# Patient Record
Sex: Male | Born: 1990 | Race: Black or African American | Hispanic: No | Marital: Single | Smoking: Never smoker
Health system: Southern US, Community
[De-identification: ages and names within clinical notes are randomized; demographics above are authoritative.]

## PROBLEM LIST (undated history)

## (undated) DIAGNOSIS — Z923 Personal history of irradiation: Secondary | ICD-10-CM

## (undated) DIAGNOSIS — L910 Hypertrophic scar: Secondary | ICD-10-CM

## (undated) HISTORY — PX: KELOID EXCISION: SHX1856

---
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
## 2014-02-20 DIAGNOSIS — L910 Hypertrophic scar: Secondary | ICD-10-CM

## 2014-02-20 HISTORY — DX: Hypertrophic scar: L91.0

## 2014-03-03 ENCOUNTER — Encounter (HOSPITAL_BASED_OUTPATIENT_CLINIC_OR_DEPARTMENT_OTHER): Payer: Self-pay | Admitting: *Deleted

## 2014-03-04 ENCOUNTER — Other Ambulatory Visit: Payer: Self-pay | Admitting: Plastic Surgery

## 2014-03-04 DIAGNOSIS — L910 Hypertrophic scar: Secondary | ICD-10-CM

## 2014-03-04 NOTE — H&P (Signed)
Kevin Sullivan is an 22 y.o. male.   Chief Complaint: left earlobe keloid HPI: The patient is a 22 yrs old bm here for evaluation/treatment of a growing keloid on the left posterior ear lobe. He had his ears pierced in 2011. Over the past 2 years he noticed the keloid growing and underwent 7 kenalog injections for treatment. He is concerned that the growth has not stopped and that it will get larger. He has no other keloids. The right lobe has a small hypertrophic scar. He is otherwise in good health and there are no known keloids in mother or father.   Past Medical History  Diagnosis Date  . Keloid 02/2014    left ear lobe    Past Surgical History  Procedure Laterality Date  . Closed reduction distal radius fracture Right 05/08/2006    with application of cast  . Keloid excision Left     ear lobe    No family history on file. Social History:  reports that he has never smoked. He has never used smokeless tobacco. He reports that he does not drink alcohol or use illicit drugs.  Allergies: No Known Allergies   (Not in a hospital admission)  No results found for this or any previous visit (from the past 48 hour(s)). No results found.  Review of Systems  Constitutional: Negative.   HENT: Negative.   Eyes: Negative.   Respiratory: Negative.   Cardiovascular: Negative.   Gastrointestinal: Negative.   Genitourinary: Negative.   Musculoskeletal: Negative.   Skin: Negative.   Neurological: Negative.   Psychiatric/Behavioral: Negative.     There were no vitals taken for this visit. Physical Exam  Constitutional: He appears well-developed and well-nourished.  HENT:  Head: Normocephalic and atraumatic.  Eyes: Conjunctivae and EOM are normal. Pupils are equal, round, and reactive to light.  Cardiovascular: Normal rate.   Respiratory: Effort normal.  Musculoskeletal: Normal range of motion.  Neurological: He is alert.  Skin: Skin is warm.  Psychiatric: He has a normal mood  and affect. His behavior is normal. Judgment and thought content normal.     Assessment/Plan Plan for excision of left earlobe keloid with Acell placement and primary closure.  SANGER,CLAIRE 03/04/2014, 3:09 PM    

## 2014-03-08 NOTE — Progress Notes (Signed)
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 male with a growing keloid on the left posterior ear lobe. He had his ears pierced in 2011 and over the past 2 years he has noticed the keloid growing. He underwent 7 kenalog injections for this but does not feel they helped. He has no other keloids  Past/Anticipated interventions by patient's surgeon/dermatologist:Dr. Sanger plans to excise this on 3-19 and has referred the patient for radiation treatments due to concern of recurrence  .  current problematic lesion, if any: left Posterior Ear Lobe  Past skin cancers, if any:  1) Location/Histology/Intervention:Left Posterior Ear Keloid  SAFETY ISSUES:  Prior radiation? No  Pacemaker/ICD? No  Possible current pregnancy?N/A  Is the patient on methotrexate? No  Current Complaints / other details: reports that he has never smoked. He has never used smokeless tobacco. He reports that he does not drink alcohol or use illicit drugs. Denies taking any daily medications. Reports occasionally his left ear keloid aches but, not so much he has to take medication to relieve this pain. Presented for consultation with his mother, Mrs. Kevin Sullivan.

## 2014-03-08 NOTE — Progress Notes (Signed)
Radiation Oncology         [phone] ________________________________  Initial outpatient Consultation  Name: Kevin Sullivan MRN: [id number]  Date: 03/09/2014  DOB: 09-Dec-1991  CC:No PCP Per Patient  Sanger, Alan Ripper, DO   REFERRING PHYSICIAN: Sanger, Alan Ripper, DO  DIAGNOSIS: The primary encounter diagnosis was Keloid. A diagnosis of Keloid scar of skin was also pertinent to this visit.  HISTORY OF PRESENT ILLNESS::Kevin Sullivan is a 23 y.o. male with a growing keloid on the left posterior ear lobe. He had his ears pierced in 2011 and over the past 2 years he has noticed the keloid growing. He had this previously removed in 2012 or 2013, and recalls laser treatments after surgical removal. However, the keloid returned. He underwent 7 kenalog injections for this but does not feel they helped very much. He has no other keloids.  He anticipated surgery tomorrow with Dr. Kelly Splinter.  He reports that she discussed some type of "shots" as prophylactic treatment after surgery, but she parenthetically felt that radiotherapy was a better alternative to prevent recurrences. His mother believes that the shots she was talking about were steroid treatments.  PREVIOUS RADIATION THERAPY: No  PAST MEDICAL HISTORY:  has a past medical history of Keloid (02/2014).    PAST SURGICAL HISTORY: Past Surgical History  Procedure Laterality Date  . Closed reduction distal radius fracture Right 05/08/2006    with application of cast  . Keloid excision Left     ear lobe    FAMILY HISTORY: family history is not on file.  SOCIAL HISTORY:  reports that he has never smoked. He has never used smokeless tobacco. He reports that he does not drink alcohol or use illicit drugs.  ALLERGIES: Review of patient's allergies indicates no known allergies.  MEDICATIONS:  No current outpatient prescriptions on file.   No current facility-administered medications for this encounter.    REVIEW OF SYSTEMS:  Notable for  that above.   PHYSICAL EXAM:  height is 6\' 1"  (1.854 m) and weight is 170 lb 4.8 oz (77.248 kg). His oral temperature is 97.8 F (36.6 C). His blood pressure is 130/94 and his pulse is 75. His respiration is 16 and oxygen saturation is 100%.   Well nourished. NAD. Alert and oriented x 3. The left posterior ear lobe has a round keloid that is approximately 1-1/2 cm in greatest dimension. There is a hypertrophic scar over the right earlobe at the site of prior piercing   LABORATORY DATA:  No results found for this basename: WBC,  HGB,  HCT,  MCV,  PLT   CMP  No results found for this basename: na,  k,  cl,  co2,  glucose,  bun,  creatinine,  calcium,  prot,  albumin,  ast,  alt,  alkphos,  bilitot,  gfrnonaa,  gfraa       RADIOGRAPHY: No results found.    IMPRESSION/PLAN: This is a very pleasant 23 year old man who has a keloid of the left ear lobe.   Dr. Kelly Splinter plans to excise this on 3-19 and has referred the patient for radiation treatments due to concern of recurrence.  I discussed steroid injections as an alternative therapy to prevent recurrences. However, the patient and his mother are more enthusiastic about radiotherapy.  I had an in depth discussion with the patient about the risks benefits and side effects of radiotherapy to the left earlobe scar postoperatively. He understands that radiotherapy would need to be planned for Friday morning, ideally  with the first fraction delivered  Later in the day on Friday, March 20, for best results. This will be followed by 2 more fractions of radiotherapy with 1-2 days of break between each treatment. He understands that treatment will be very superficial and targeted, to a limited amount of tissue, to minimize side effects. He understands that the most common side effects are temporary fatigue as well as skin irritation which can result in long-term changes in skin pigmentation. There is a small risk of damage to the cartilage or underlying  bone. The risk of secondary malignancy is very small but not nonexistent. The patient understands these risks but is still very enthusiastic about proceeding with treatment.    I plan to treat the patient's scar with tight margins to 12 Gray in 3 fractions. I will use electrons with bolus to allow a high dose to the skin with minimal deep penetration into normal tissues.   __________________________________________   Lonie PeakSarah Kelvyn Schunk, MD

## 2014-03-09 ENCOUNTER — Encounter: Payer: Self-pay | Admitting: Radiation Oncology

## 2014-03-09 ENCOUNTER — Ambulatory Visit
Admission: RE | Admit: 2014-03-09 | Discharge: 2014-03-09 | Disposition: A | Discharge status: Home / Self Care | Payer: No Typology Code available for payment source | Source: Ambulatory Visit | Attending: Radiation Oncology | Admitting: Radiation Oncology

## 2014-03-09 VITALS — BP 130/94 | HR 75 | Temp 97.8°F | Resp 16 | Ht 73.0 in | Wt 170.3 lb

## 2014-03-09 DIAGNOSIS — L910 Hypertrophic scar: Secondary | ICD-10-CM | POA: Insufficient documentation

## 2014-03-09 NOTE — Addendum Note (Signed)
Encounter addended by: Delynn Flavinheryl Mintz Brean Carberry, RN on: 03/09/2014  2:49 PM<BR>     Documentation filed: Visit Diagnoses

## 2014-03-09 NOTE — Progress Notes (Signed)
See progress note under physician encounter. 

## 2014-03-09 NOTE — Addendum Note (Signed)
Encounter addended by: Delynn Flavinheryl Mintz Ramon Zanders, RN on: 03/09/2014 10:34 AM<BR>     Documentation filed: Charges VN

## 2014-03-10 ENCOUNTER — Encounter (HOSPITAL_BASED_OUTPATIENT_CLINIC_OR_DEPARTMENT_OTHER): Payer: Self-pay

## 2014-03-10 ENCOUNTER — Encounter (HOSPITAL_BASED_OUTPATIENT_CLINIC_OR_DEPARTMENT_OTHER): Payer: No Typology Code available for payment source | Admitting: Anesthesiology

## 2014-03-10 ENCOUNTER — Encounter (HOSPITAL_BASED_OUTPATIENT_CLINIC_OR_DEPARTMENT_OTHER)
Admission: RE | Disposition: A | Discharge status: Home / Self Care | Payer: Self-pay | Source: Ambulatory Visit | Attending: Plastic Surgery

## 2014-03-10 ENCOUNTER — Ambulatory Visit (HOSPITAL_BASED_OUTPATIENT_CLINIC_OR_DEPARTMENT_OTHER)
Admission: RE | Admit: 2014-03-10 | Discharge: 2014-03-10 | Disposition: A | Discharge status: Home / Self Care | Payer: No Typology Code available for payment source | Source: Ambulatory Visit | Attending: Plastic Surgery | Admitting: Plastic Surgery

## 2014-03-10 ENCOUNTER — Ambulatory Visit (HOSPITAL_BASED_OUTPATIENT_CLINIC_OR_DEPARTMENT_OTHER): Payer: No Typology Code available for payment source | Admitting: Anesthesiology

## 2014-03-10 DIAGNOSIS — L910 Hypertrophic scar: Secondary | ICD-10-CM | POA: Insufficient documentation

## 2014-03-10 HISTORY — PX: APPLICATION OF A-CELL OF HEAD/NECK: SHX6304

## 2014-03-10 HISTORY — PX: LESION REMOVAL: SHX5196

## 2014-03-10 HISTORY — DX: Hypertrophic scar: L91.0

## 2014-03-10 LAB — POCT HEMOGLOBIN-HEMACUE: Hemoglobin: 14.5 g/dL (ref 13.0–17.0)

## 2014-03-10 SURGERY — WIDE EXCISION, LESION, UPPER EXTREMITY
Anesthesia: General | Site: Ear | Laterality: Left

## 2014-03-10 MED ORDER — FENTANYL CITRATE 0.05 MG/ML IJ SOLN
INTRAMUSCULAR | Status: AC
  Filled 2014-03-10: qty 6

## 2014-03-10 MED ORDER — OXYCODONE HCL 5 MG PO TABS: 5.0000 mg | ORAL_TABLET | Freq: Once | ORAL | Status: DC | PRN

## 2014-03-10 MED ORDER — SUCCINYLCHOLINE CHLORIDE 20 MG/ML IJ SOLN
INTRAMUSCULAR | Status: AC
  Filled 2014-03-10: qty 1

## 2014-03-10 MED ORDER — MIDAZOLAM HCL 2 MG/ML PO SYRP: 12.0000 mg | ORAL_SOLUTION | Freq: Once | ORAL | Status: DC | PRN

## 2014-03-10 MED ORDER — OXYCODONE HCL 5 MG/5ML PO SOLN: 5.0000 mg | Freq: Once | ORAL | Status: DC | PRN

## 2014-03-10 MED ORDER — LACTATED RINGERS IV SOLN
INTRAVENOUS | Status: DC
  Administered 2014-03-10: 07:00:00 via INTRAVENOUS

## 2014-03-10 MED ORDER — CEFAZOLIN SODIUM-DEXTROSE 2-3 GM-% IV SOLR
2.0000 g | INTRAVENOUS | Status: AC
  Administered 2014-03-10: 2 g via INTRAVENOUS

## 2014-03-10 MED ORDER — FENTANYL CITRATE 0.05 MG/ML IJ SOLN
50.0000 ug | Freq: Once | INTRAMUSCULAR | Status: DC
Start: 2014-03-10 — End: 2014-03-10

## 2014-03-10 MED ORDER — LIDOCAINE-EPINEPHRINE 1 %-1:100000 IJ SOLN
INTRAMUSCULAR | Status: DC | PRN
  Administered 2014-03-10: 1 mL

## 2014-03-10 MED ORDER — CEFAZOLIN SODIUM-DEXTROSE 2-3 GM-% IV SOLR
INTRAVENOUS | Status: AC
  Filled 2014-03-10: qty 50

## 2014-03-10 MED ORDER — BACITRACIN ZINC 500 UNIT/GM EX OINT
TOPICAL_OINTMENT | CUTANEOUS | Status: AC
  Filled 2014-03-10: qty 0.9

## 2014-03-10 MED ORDER — MIDAZOLAM HCL 2 MG/2ML IJ SOLN
INTRAMUSCULAR | Status: AC
  Filled 2014-03-10: qty 2

## 2014-03-10 MED ORDER — BUPIVACAINE-EPINEPHRINE PF 0.25-1:200000 % IJ SOLN
INTRAMUSCULAR | Status: AC
  Filled 2014-03-10: qty 30

## 2014-03-10 MED ORDER — DEXAMETHASONE SODIUM PHOSPHATE 4 MG/ML IJ SOLN
INTRAMUSCULAR | Status: DC | PRN
  Administered 2014-03-10: 10 mg via INTRAVENOUS

## 2014-03-10 MED ORDER — ONDANSETRON HCL 4 MG/2ML IJ SOLN
INTRAMUSCULAR | Status: DC | PRN
  Administered 2014-03-10: 4 mg via INTRAVENOUS

## 2014-03-10 MED ORDER — MIDAZOLAM HCL 5 MG/5ML IJ SOLN
INTRAMUSCULAR | Status: DC | PRN
  Administered 2014-03-10: 2 mg via INTRAVENOUS

## 2014-03-10 MED ORDER — LIDOCAINE HCL (CARDIAC) 20 MG/ML IV SOLN
INTRAVENOUS | Status: DC | PRN
  Administered 2014-03-10: 50 mg via INTRAVENOUS

## 2014-03-10 MED ORDER — PROMETHAZINE HCL 25 MG/ML IJ SOLN: 12.5000 mg | Freq: Once | INTRAMUSCULAR | Status: DC | PRN

## 2014-03-10 MED ORDER — HYDROMORPHONE HCL PF 1 MG/ML IJ SOLN: 0.2500 mg | INTRAMUSCULAR | Status: DC | PRN

## 2014-03-10 MED ORDER — LIDOCAINE-EPINEPHRINE 1 %-1:100000 IJ SOLN
INTRAMUSCULAR | Status: AC
  Filled 2014-03-10: qty 1

## 2014-03-10 MED ORDER — FENTANYL CITRATE 0.05 MG/ML IJ SOLN: 50.0000 ug | INTRAMUSCULAR | Status: DC | PRN

## 2014-03-10 MED ORDER — MIDAZOLAM HCL 2 MG/2ML IJ SOLN: 1.0000 mg | INTRAMUSCULAR | Status: DC | PRN

## 2014-03-10 MED ORDER — PROPOFOL 10 MG/ML IV EMUL
INTRAVENOUS | Status: AC
  Filled 2014-03-10: qty 50

## 2014-03-10 MED ORDER — FENTANYL CITRATE 0.05 MG/ML IJ SOLN
INTRAMUSCULAR | Status: DC | PRN
Start: 2014-03-10 — End: 2014-03-10
  Administered 2014-03-10: 100 ug via INTRAVENOUS

## 2014-03-10 MED ORDER — PROPOFOL 10 MG/ML IV BOLUS
INTRAVENOUS | Status: DC | PRN
  Administered 2014-03-10: 250 mg via INTRAVENOUS

## 2014-03-10 SURGICAL SUPPLY — 84 items
ADH SKN CLS APL DERMABOND .7 (GAUZE/BANDAGES/DRESSINGS)
BAG DECANTER FOR FLEXI CONT (MISCELLANEOUS) IMPLANT
BLADE 11 SAFETY STRL DISP (BLADE) ×2 IMPLANT
BLADE HEX COATED 2.75 (ELECTRODE) IMPLANT
BLADE SURG 10 STRL SS (BLADE) IMPLANT
BLADE SURG 15 STRL LF DISP TIS (BLADE) ×1 IMPLANT
BLADE SURG 15 STRL SS (BLADE) ×3
BLADE SURG ROTATE 9660 (MISCELLANEOUS) IMPLANT
BNDG CMPR MD 5X2 ELC HKLP STRL (GAUZE/BANDAGES/DRESSINGS)
BNDG CONFORM 2 STRL LF (GAUZE/BANDAGES/DRESSINGS) IMPLANT
BNDG ELASTIC 2 VLCR STRL LF (GAUZE/BANDAGES/DRESSINGS) IMPLANT
CANISTER SUCT 1200ML W/VALVE (MISCELLANEOUS) IMPLANT
CHLORAPREP W/TINT 26ML (MISCELLANEOUS) IMPLANT
CLOSURE WOUND 1/2 X4 (GAUZE/BANDAGES/DRESSINGS)
CORDS BIPOLAR (ELECTRODE) IMPLANT
COVER MAYO STAND STRL (DRAPES) ×3 IMPLANT
COVER TABLE BACK 60X90 (DRAPES) ×3 IMPLANT
DECANTER SPIKE VIAL GLASS SM (MISCELLANEOUS) ×3 IMPLANT
DERMABOND ADVANCED (GAUZE/BANDAGES/DRESSINGS)
DERMABOND ADVANCED .7 DNX12 (GAUZE/BANDAGES/DRESSINGS) IMPLANT
DRAPE INCISE IOBAN 66X45 STRL (DRAPES) IMPLANT
DRAPE PED LAPAROTOMY (DRAPES) IMPLANT
DRAPE U-SHAPE 76X120 STRL (DRAPES) ×3 IMPLANT
DRSG ADAPTIC 3X8 NADH LF (GAUZE/BANDAGES/DRESSINGS) IMPLANT
DRSG EMULSION OIL 3X3 NADH (GAUZE/BANDAGES/DRESSINGS) IMPLANT
DRSG PAD ABDOMINAL 8X10 ST (GAUZE/BANDAGES/DRESSINGS) IMPLANT
DRSG TEGADERM 2-3/8X2-3/4 SM (GAUZE/BANDAGES/DRESSINGS) IMPLANT
ELECT NDL BLADE 2-5/6 (NEEDLE) ×1 IMPLANT
ELECT NEEDLE BLADE 2-5/6 (NEEDLE) ×3 IMPLANT
ELECT REM PT RETURN 9FT ADLT (ELECTROSURGICAL) ×3
ELECT REM PT RETURN 9FT PED (ELECTROSURGICAL)
ELECTRODE REM PT RETRN 9FT PED (ELECTROSURGICAL) IMPLANT
ELECTRODE REM PT RTRN 9FT ADLT (ELECTROSURGICAL) ×1 IMPLANT
GAUZE XEROFORM 1X8 LF (GAUZE/BANDAGES/DRESSINGS) ×3 IMPLANT
GLOVE BIO SURGEON STRL SZ 6.5 (GLOVE) ×5 IMPLANT
GLOVE BIO SURGEONS STRL SZ 6.5 (GLOVE) ×3
GLOVE BIOGEL PI IND STRL 7.5 (GLOVE) IMPLANT
GLOVE BIOGEL PI INDICATOR 7.5 (GLOVE) ×2
GLOVE SURG SS PI 7.5 STRL IVOR (GLOVE) ×2 IMPLANT
GOWN STRL REUS W/ TWL LRG LVL3 (GOWN DISPOSABLE) ×2 IMPLANT
GOWN STRL REUS W/ TWL XL LVL3 (GOWN DISPOSABLE) IMPLANT
GOWN STRL REUS W/TWL LRG LVL3 (GOWN DISPOSABLE) ×6
GOWN STRL REUS W/TWL XL LVL3 (GOWN DISPOSABLE) ×3
MICROMATRIX 500MG (Tissue) ×3 IMPLANT
NDL HYPO 30GX1 BEV (NEEDLE) ×1 IMPLANT
NEEDLE 27GAX1X1/2 (NEEDLE) IMPLANT
NEEDLE HYPO 30GX1 BEV (NEEDLE) ×3 IMPLANT
NS IRRIG 1000ML POUR BTL (IV SOLUTION) ×1 IMPLANT
PACK BASIN DAY SURGERY FS (CUSTOM PROCEDURE TRAY) ×3 IMPLANT
PENCIL BUTTON HOLSTER BLD 10FT (ELECTRODE) ×3 IMPLANT
SHEET MEDIUM DRAPE 40X70 STRL (DRAPES) IMPLANT
SLEEVE SCD COMPRESS KNEE MED (MISCELLANEOUS) ×2 IMPLANT
SOLUTION PARTIC MCRMTRX 500MG (Tissue) IMPLANT
SPONGE GAUZE 2X2 8PLY STER LF (GAUZE/BANDAGES/DRESSINGS) ×1
SPONGE GAUZE 2X2 8PLY STRL LF (GAUZE/BANDAGES/DRESSINGS) ×1 IMPLANT
SPONGE GAUZE 4X4 12PLY (GAUZE/BANDAGES/DRESSINGS) ×3 IMPLANT
SPONGE GAUZE 4X4 12PLY STER LF (GAUZE/BANDAGES/DRESSINGS) IMPLANT
SPONGE LAP 18X18 X RAY DECT (DISPOSABLE) IMPLANT
STAPLER VISISTAT 35W (STAPLE) IMPLANT
STRIP CLOSURE SKIN 1/2X4 (GAUZE/BANDAGES/DRESSINGS) IMPLANT
SUCTION FRAZIER TIP 10 FR DISP (SUCTIONS) IMPLANT
SURGILUBE 2OZ TUBE FLIPTOP (MISCELLANEOUS) IMPLANT
SUT MNCRL 6-0 UNDY P1 1X18 (SUTURE) IMPLANT
SUT MNCRL AB 4-0 PS2 18 (SUTURE) ×3 IMPLANT
SUT MON AB 5-0 P3 18 (SUTURE) IMPLANT
SUT MON AB 5-0 PS2 18 (SUTURE) IMPLANT
SUT MONOCRYL 6-0 P1 1X18 (SUTURE) ×2
SUT PROLENE 5 0 P 3 (SUTURE) IMPLANT
SUT PROLENE 5 0 PS 2 (SUTURE) IMPLANT
SUT PROLENE 6 0 P 1 18 (SUTURE) IMPLANT
SUT VIC AB 3-0 FS2 27 (SUTURE) IMPLANT
SUT VIC AB 5-0 P-3 18X BRD (SUTURE) IMPLANT
SUT VIC AB 5-0 P3 18 (SUTURE)
SUT VIC AB 5-0 PS2 18 (SUTURE) IMPLANT
SUT VICRYL 4-0 PS2 18IN ABS (SUTURE) IMPLANT
SYR BULB 3OZ (MISCELLANEOUS) IMPLANT
SYR BULB IRRIGATION 50ML (SYRINGE) IMPLANT
SYR CONTROL 10ML LL (SYRINGE) ×3 IMPLANT
TOWEL OR 17X24 6PK STRL BLUE (TOWEL DISPOSABLE) ×3 IMPLANT
TRAY DSU PREP LF (CUSTOM PROCEDURE TRAY) ×3 IMPLANT
TUBE CONNECTING 20'X1/4 (TUBING)
TUBE CONNECTING 20X1/4 (TUBING) IMPLANT
UNDERPAD 30X30 INCONTINENT (UNDERPADS AND DIAPERS) IMPLANT
YANKAUER SUCT BULB TIP NO VENT (SUCTIONS) IMPLANT

## 2014-03-10 NOTE — Addendum Note (Signed)
Encounter addended by: Norlene Lanes Mintz Shaterra Sanzone, RN on: 03/10/2014  9:29 AM<BR>     Documentation filed: Charges VN

## 2014-03-10 NOTE — Discharge Instructions (Signed)
May remove dressing tomorrow if needed for radiation. Keep area dry for 2 days, then may get wet.  Call your surgeon if you experience:   1.  Fever over 101.0. 2.  Inability to urinate. 3.  Nausea and/or vomiting. 4.  Extreme swelling or bruising at the surgical site. 5.  Continued bleeding from the incision. 6.  Increased pain, redness or drainage from the incision. 7.  Problems related to your pain medication.   Post Anesthesia Home Care Instructions  Activity: Get plenty of rest for the remainder of the day. A responsible adult should stay with you for 24 hours following the procedure.  For the next 24 hours, DO NOT: -Drive a car -Advertising copywriterperate machinery -Drink alcoholic beverages -Take any medication unless instructed by your physician -Make any legal decisions or sign important papers.  Meals: Start with liquid foods such as gelatin or soup. Progress to regular foods as tolerated. Avoid greasy, spicy, heavy foods. If nausea and/or vomiting occur, drink only clear liquids until the nausea and/or vomiting subsides. Call your physician if vomiting continues.  Special Instructions/Symptoms: Your throat may feel dry or sore from the anesthesia or the breathing tube placed in your throat during surgery. If this causes discomfort, gargle with warm salt water. The discomfort should disappear within 24 hours.

## 2014-03-10 NOTE — Anesthesia Procedure Notes (Signed)
Procedure Name: LMA Insertion Date/Time: 03/10/2014 7:46 AM Performed by: Zenia ResidesPAYNE, Joie Hipps D Pre-anesthesia Checklist: Patient identified, Emergency Drugs available, Suction available and Patient being monitored Patient Re-evaluated:Patient Re-evaluated prior to inductionOxygen Delivery Method: Circle System Utilized Preoxygenation: Pre-oxygenation with 100% oxygen Intubation Type: IV induction Ventilation: Mask ventilation without difficulty LMA: LMA inserted LMA Size: 5.0 Number of attempts: 1 Airway Equipment and Method: bite block Placement Confirmation: positive ETCO2 Tube secured with: Tape Dental Injury: Teeth and Oropharynx as per pre-operative assessment

## 2014-03-10 NOTE — Anesthesia Preprocedure Evaluation (Addendum)
Anesthesia Evaluation  Patient identified by MRN, date of birth, ID band Patient awake    Reviewed: Allergy & Precautions, H&P , NPO status , Patient's Chart, lab work & pertinent test results  Airway Mallampati: I TM Distance: >3 FB Neck ROM: Full    Dental   Pulmonary  breath sounds clear to auscultation        Cardiovascular Rhythm:Regular Rate:Normal     Neuro/Psych    GI/Hepatic   Endo/Other    Renal/GU      Musculoskeletal   Abdominal   Peds  Hematology   Anesthesia Other Findings   Reproductive/Obstetrics                           Anesthesia Physical Anesthesia Plan  ASA: I  Anesthesia Plan: General   Post-op Pain Management:    Induction: Intravenous  Airway Management Planned: LMA  Additional Equipment:   Intra-op Plan:   Post-operative Plan: Extubation in OR  Informed Consent: I have reviewed the patients History and Physical, chart, labs and discussed the procedure including the risks, benefits and alternatives for the proposed anesthesia with the patient or authorized representative who has indicated his/her understanding and acceptance.     Plan Discussed with: CRNA and Surgeon  Anesthesia Plan Comments:         Anesthesia Quick Evaluation  

## 2014-03-10 NOTE — Op Note (Signed)
Operative Note   DATE OF OPERATION: 03/10/2014  [facility]  SURGICAL DIVISION: Plastic Surgery  PREOPERATIVE DIAGNOSES:  Left ear keloid  POSTOPERATIVE DIAGNOSES:  same  PROCEDURE:  Excision of left ear keloid with Acell placement and primary closure 2 cm  SURGEON: Wayland Denislaire Sanger, DO  ASSISTANT: Lazaro ArmsShawn Rayburn, PA  ANESTHESIA:  General.   COMPLICATIONS: None.   INDICATIONS FOR PROCEDURE:  The patient, Kevin Sullivan, is a 23 y.o. male born on 1991-11-24, is here for treatment of a left ear keloid   CONSENT:  Informed consent was obtained directly from the patient. Risks, benefits and alternatives were fully discussed. Specific risks including but not limited to bleeding, infection, hematoma, seroma, scarring, pain, implant infection, implant extrusion, capsular contracture, asymmetry, wound healing problems, and need for further surgery were all discussed. The patient did have an ample opportunity to have questions answered to satisfaction.   DESCRIPTION OF PROCEDURE:  The patient was taken to the operating room. SCDs were placed and IV antibiotics were given. The patient's operative site was prepped and draped in a sterile fashion. A time out was performed and all information was confirmed to be correct.  General anesthesia was administered.  The ear was injected with local for intraoperative hemostasis and post operative pain management.  The keloid was excised.  Primary closure was performed to obtain a round lobe.  The Acell powder was placed prior to closure.  6-0 Monocryl was used with simple interuped sutures.  The patient tolerated the procedure well.  There were no complications. The patient was allowed to wake from anesthesia, extubated and taken to the recovery room in satisfactory condition.

## 2014-03-10 NOTE — Interval H&P Note (Signed)
History and Physical Interval Note:  03/10/2014 7:17 AM  Kevin Sullivan  has presented today for surgery, with the diagnosis of KELOID LEFT EARLOBE   The various methods of treatment have been discussed with the patient and family. After consideration of risks, benefits and other options for treatment, the patient has consented to  Procedure(s): EXCISION OF LEFT EARLOBE KELOID WITH PLACEMENT OF A- CELL  (Left) APPLICATION OF A-CELL OF HEAD/NECK (Left) as a surgical intervention .  The patient's history has been reviewed, patient examined, no change in status, stable for surgery.  I have reviewed the patient's chart and labs.  Questions were answered to the patient's satisfaction.     SANGER,CLAIRE

## 2014-03-10 NOTE — Brief Op Note (Signed)
03/10/2014  8:24 AM  PATIENT:  Kevin Sullivan  23 y.o. male  PRE-OPERATIVE DIAGNOSIS:  KELOID LEFT EARLOBE   POST-OPERATIVE DIAGNOSIS:  KELOID LEFT EARLOBE   PROCEDURE:  Procedure(s): EXCISION OF LEFT EARLOBE KELOID WITH PLACEMENT OF A- CELL  (Left) APPLICATION OF A-CELL OF HEAD/NECK (Left)  SURGEON:  Surgeon(s) and Role:    * Claire Sanger, DO - Primary  PHYSICIAN ASSISTANT: Shawn Rayburn, PA  ASSISTANTS: none   ANESTHESIA:   local and general  EBL:  Total I/O In: 1000 [I.V.:1000] Out: -   BLOOD ADMINISTERED:none  DRAINS: none   LOCAL MEDICATIONS USED:  LIDOCAINE   SPECIMEN:  Source of Specimen:  left ear keloid  DISPOSITION OF SPECIMEN:  PATHOLOGY  COUNTS:  YES  TOURNIQUET:  * No tourniquets in log *  DICTATION: .Dragon Dictation  PLAN OF CARE: Discharge to home after PACU  PATIENT DISPOSITION:  PACU - hemodynamically stable.   Delay start of Pharmacological VTE agent (>24hrs) due to surgical blood loss or risk of bleeding: no

## 2014-03-10 NOTE — H&P (View-Only) (Signed)
Kevin Sullivan is an 23 y.o. male.   Chief Complaint: left earlobe keloid HPI: The patient is a 23 yrs old bm here for evaluation/treatment of a growing keloid on the left posterior ear lobe. He had his ears pierced in 2011. Over the past 2 years he noticed the keloid growing and underwent 7 kenalog injections for treatment. He is concerned that the growth has not stopped and that it will get larger. He has no other keloids. The right lobe has a small hypertrophic scar. He is otherwise in good health and there are no known keloids in mother or father.   Past Medical History  Diagnosis Date  . Keloid 02/2014    left ear lobe    Past Surgical History  Procedure Laterality Date  . Closed reduction distal radius fracture Right 05/08/2006    with application of cast  . Keloid excision Left     ear lobe    No family history on file. Social History:  reports that he has never smoked. He has never used smokeless tobacco. He reports that he does not drink alcohol or use illicit drugs.  Allergies: No Known Allergies   (Not in a hospital admission)  No results found for this or any previous visit (from the past 48 hour(s)). No results found.  Review of Systems  Constitutional: Negative.   HENT: Negative.   Eyes: Negative.   Respiratory: Negative.   Cardiovascular: Negative.   Gastrointestinal: Negative.   Genitourinary: Negative.   Musculoskeletal: Negative.   Skin: Negative.   Neurological: Negative.   Psychiatric/Behavioral: Negative.     There were no vitals taken for this visit. Physical Exam  Constitutional: He appears well-developed and well-nourished.  HENT:  Head: Normocephalic and atraumatic.  Eyes: Conjunctivae and EOM are normal. Pupils are equal, round, and reactive to light.  Cardiovascular: Normal rate.   Respiratory: Effort normal.  Musculoskeletal: Normal range of motion.  Neurological: He is alert.  Skin: Skin is warm.  Psychiatric: He has a normal mood  and affect. His behavior is normal. Judgment and thought content normal.     Assessment/Plan Plan for excision of left earlobe keloid with Acell placement and primary closure.  Sullivan,Kevin 03/04/2014, 3:09 PM

## 2014-03-10 NOTE — Transfer of Care (Signed)
Immediate Anesthesia Transfer of Care Note  Patient: Kevin Sullivan  Procedure(s) Performed: Procedure(s): EXCISION OF LEFT EARLOBE KELOID WITH PLACEMENT OF A- CELL  (Left) APPLICATION OF A-CELL OF HEAD/NECK (Left)  Patient Location: PACU  Anesthesia Type:General  Level of Consciousness: awake  Airway & Oxygen Therapy: Patient Spontanous Breathing and Patient connected to face mask oxygen  Post-op Assessment: Report given to PACU RN and Post -op Vital signs reviewed and stable  Post vital signs: Reviewed and stable  Complications: No apparent anesthesia complications

## 2014-03-10 NOTE — Anesthesia Postprocedure Evaluation (Signed)
  Anesthesia Post-op Note  Patient: Kevin Sullivan  Procedure(s) Performed: Procedure(s): EXCISION OF LEFT EARLOBE KELOID WITH PLACEMENT OF A- CELL  (Left) APPLICATION OF A-CELL OF HEAD/NECK (Left)  Patient Location: PACU  Anesthesia Type:General  Level of Consciousness: awake  Airway and Oxygen Therapy: Patient Spontanous Breathing  Post-op Pain: mild  Post-op Assessment: Post-op Vital signs reviewed, Patient's Cardiovascular Status Stable, Respiratory Function Stable, Patent Airway, No signs of Nausea or vomiting and Pain level controlled  Post-op Vital Signs: Reviewed and stable  Complications: No apparent anesthesia complications

## 2014-03-11 ENCOUNTER — Ambulatory Visit
Admission: RE | Admit: 2014-03-11 | Discharge: 2014-03-11 | Disposition: A | Discharge status: Home / Self Care | Payer: No Typology Code available for payment source | Source: Ambulatory Visit | Attending: Radiation Oncology | Admitting: Radiation Oncology

## 2014-03-11 ENCOUNTER — Encounter (HOSPITAL_BASED_OUTPATIENT_CLINIC_OR_DEPARTMENT_OTHER): Payer: Self-pay | Admitting: Plastic Surgery

## 2014-03-11 DIAGNOSIS — L910 Hypertrophic scar: Secondary | ICD-10-CM | POA: Insufficient documentation

## 2014-03-11 DIAGNOSIS — Z510 Encounter for antineoplastic radiation therapy: Secondary | ICD-10-CM | POA: Insufficient documentation

## 2014-03-11 NOTE — Progress Notes (Signed)
Mr. Genelle GatherShoffner had his first treatment to his left earlobe s/p revision of a keloid.  The sutures are intact and no evidence of bleeding.  Bandaid applied and adhered in place with hypafix tape.

## 2014-03-11 NOTE — Progress Notes (Signed)
SIMPLE SIMULATION AND TREATMENT PLANNING NOTE  outpatient   DIAGNOSIS: Left ear lobe keloid scar   NARRATIVE: [facility] Identity was confirmed. All relevant records and images related to the planned course of therapy were reviewed. The patient freely provided informed written consent to proceed with treatment after reviewing the details related to the planned course of therapy. The consent form was witnessed and verified by the simulation staff.  Then, the patient was set-up in a stable reproducible prone position for radiation therapy. Surface markings were placed to provide margin around the keloid scar. The gantry was moved to an en face position.   Treatment Planning Note  Measurements were made regarding depth of the target. The treatment will be given with 6 MeV electrons prescribed to the 95% isodose line. A special port plan was reviewed and approved. A custom electron cut-out will be used for the field. 0.8cm bolus will be applied.   12 Gy in 3 fractions will be delivered every other day.  -----------------------------------  Lonie PeakSarah Olin Gurski, MD

## 2014-03-14 ENCOUNTER — Encounter: Payer: Self-pay | Admitting: Radiation Oncology

## 2014-03-14 ENCOUNTER — Ambulatory Visit
Admission: RE | Admit: 2014-03-14 | Discharge: 2014-03-14 | Disposition: A | Discharge status: Home / Self Care | Payer: No Typology Code available for payment source | Source: Ambulatory Visit | Attending: Radiation Oncology | Admitting: Radiation Oncology

## 2014-03-14 VITALS — BP 140/69 | HR 90 | Temp 98.5°F | Ht 73.0 in | Wt 174.2 lb

## 2014-03-14 DIAGNOSIS — L910 Hypertrophic scar: Secondary | ICD-10-CM

## 2014-03-14 NOTE — Progress Notes (Signed)
   Weekly Management Note:  Oupatient Current Dose:  8 Gy  Projected Dose: 12 Gy   Narrative:  The patient presents for routine under treatment assessment.  CBCT/MVCT images/Port film x-rays were reviewed.  The chart was checked. Doing well. No new complaints  Physical Findings:  height is 6\' 1"  (1.854 m) and weight is 174 lb 3.2 oz (79.017 kg). His temperature is 98.5 F (36.9 C). His blood pressure is 140/69 and his pulse is 90.  Left earlobe is bruised and a little swollen. Sutures intact.  Impression:  The patient is tolerating radiotherapy.  Plan:  Continue radiotherapy as planned.   ________________________________   Lonie PeakSarah Vander Kueker, M.D.

## 2014-03-14 NOTE — Progress Notes (Addendum)
Mr. Kevin Sullivan has received 2 of 3 fractions to his left earlobe.  Note swelling of the lobe and bruising on the anterior and posterior earlobe.  He denies any pain presently.  Sutures remain intact.

## 2014-03-16 ENCOUNTER — Encounter: Payer: Self-pay | Admitting: Radiation Oncology

## 2014-03-16 ENCOUNTER — Ambulatory Visit
Admission: RE | Admit: 2014-03-16 | Discharge: 2014-03-16 | Disposition: A | Discharge status: Home / Self Care | Payer: No Typology Code available for payment source | Source: Ambulatory Visit | Attending: Radiation Oncology | Admitting: Radiation Oncology

## 2014-03-20 NOTE — Progress Notes (Signed)
  Radiation Oncology         [phone]) [phone] ________________________________  Name: Kevin Sullivan D Mechling MRN: [id number]  Date: 03/16/2014  DOB: [birth year]  End of Treatment Note  DIAGNOSIS: Left ear lobe, keloid scar    INDICATION FOR TREATMENT: to prevent recurrent keloid   TREATMENT DATES: 03-11-14 to 03-16-14                         SITE/DOSE:   Left Ear lobe / 12 Gy in 3 fractions                       BEAMS/ENERGY:    En face electrons / 6 MeV electrons               NARRATIVE:   The patient tolerated radiotherapy well.                         PLAN: Follow up with surgery; return to radiation oncology clinic for routine followup in one month. I advised them to call or return sooner if they have any questions or concerns related to their recovery or treatment.  -----------------------------------  Lonie PeakSarah Donley Harland, MD

## 2014-03-28 ENCOUNTER — Encounter: Payer: Self-pay | Admitting: Radiation Oncology

## 2014-04-20 ENCOUNTER — Ambulatory Visit: Payer: No Typology Code available for payment source | Admitting: Radiation Oncology

## 2014-04-28 ENCOUNTER — Encounter: Payer: Self-pay | Admitting: Radiation Oncology

## 2014-04-29 ENCOUNTER — Encounter: Payer: Self-pay | Admitting: Radiation Oncology

## 2014-04-29 ENCOUNTER — Ambulatory Visit
Admission: RE | Admit: 2014-04-29 | Discharge: 2014-04-29 | Disposition: A | Discharge status: Home / Self Care | Payer: No Typology Code available for payment source | Source: Ambulatory Visit | Attending: Radiation Oncology | Admitting: Radiation Oncology

## 2014-04-29 VITALS — BP 113/69 | HR 83 | Temp 98.1°F | Ht 73.0 in | Wt 169.0 lb

## 2014-04-29 DIAGNOSIS — L910 Hypertrophic scar: Secondary | ICD-10-CM

## 2014-04-29 HISTORY — DX: Personal history of irradiation: Z92.3

## 2014-04-29 NOTE — Progress Notes (Signed)
  Radiation Oncology         [phone]) [phone] ________________________________  Name: Kevin Sullivan D Christy MRN: [id number]  Date: 04/29/2014  DOB: 05-10-1991  Follow-Up Visit Note  Outpatient  CC: No PCP Per Patient  Sanger, Alan Ripperlaire, DO  Diagnosis and Prior Radiotherapy:   Left ear lobe, keloid scar  INDICATION FOR TREATMENT: to prevent recurrent keloid  TREATMENT DATES: 03-11-14 to 03-16-14  SITE/DOSE: Left Ear lobe / 12 Gy in 3 fractions   Narrative:  The patient returns today for routine follow-up. He has no complaints.  Had some initial hyperpigmentation of the skin over his left earlobe after radiotherapy but this has resolved. He sees Dr. Kelly SplinterSanger for another followup in about 6 months                             ALLERGIES:  has No Known Allergies.  Meds: No current outpatient prescriptions on file.   No current facility-administered medications for this encounter.    Physical Findings: The patient is in no acute distress. Patient is alert and oriented.  height is 6\' 1"  (1.854 m) and weight is 169 lb (76.658 kg). His temperature is 98.1 F (36.7 C). His blood pressure is 113/69 and his pulse is 83. .   No residual hyperpigmentation over his left earlobe. There is a mild scar from surgery over the left posterior ear lobe but no significant hypertrophic tissue or keloid   Lab Findings: Lab Results  Component Value Date   HGB 14.5 03/10/2014    Radiographic Findings: No results found.  Impression/Plan:  Doing well. He has healed from treatment. I will see him back on a when necessary basis. I wished him the best ____________   Lonie PeakSarah Gaige Sebo, MD

## 2014-04-29 NOTE — Progress Notes (Signed)
Mr. Kevin PayorCameron Sullivan here today for a fu s/p radiation to his left earlobe for a keloid.  The incision on the posterior ear is flat and the ear lobe mirrors the right earlobe in appearance.  He denies any pain nor itching.

## 2017-08-26 ENCOUNTER — Emergency Department (HOSPITAL_COMMUNITY)
Admission: EM | Admit: 2017-08-26 | Discharge: 2017-08-26 | Disposition: A | Discharge status: Home / Self Care | Payer: No Typology Code available for payment source | Attending: Emergency Medicine | Admitting: Emergency Medicine

## 2017-08-26 ENCOUNTER — Emergency Department (HOSPITAL_COMMUNITY): Payer: No Typology Code available for payment source

## 2017-08-26 ENCOUNTER — Encounter (HOSPITAL_COMMUNITY): Payer: Self-pay | Admitting: Emergency Medicine

## 2017-08-26 DIAGNOSIS — Y92410 Unspecified street and highway as the place of occurrence of the external cause: Secondary | ICD-10-CM | POA: Insufficient documentation

## 2017-08-26 DIAGNOSIS — Y999 Unspecified external cause status: Secondary | ICD-10-CM | POA: Diagnosis not present

## 2017-08-26 DIAGNOSIS — S161XXA Strain of muscle, fascia and tendon at neck level, initial encounter: Secondary | ICD-10-CM | POA: Diagnosis not present

## 2017-08-26 DIAGNOSIS — Y9389 Activity, other specified: Secondary | ICD-10-CM | POA: Diagnosis not present

## 2017-08-26 DIAGNOSIS — S199XXA Unspecified injury of neck, initial encounter: Secondary | ICD-10-CM | POA: Diagnosis present

## 2017-08-26 MED ORDER — CYCLOBENZAPRINE HCL 10 MG PO TABS: 10.0000 mg | ORAL_TABLET | Freq: Two times a day (BID) | ORAL | 0 refills | Status: DC | PRN

## 2017-08-26 MED ORDER — NAPROXEN 375 MG PO TABS: 375.0000 mg | ORAL_TABLET | Freq: Two times a day (BID) | ORAL | 0 refills | Status: DC

## 2017-08-26 MED ORDER — CYCLOBENZAPRINE HCL 10 MG PO TABS
10.0000 mg | ORAL_TABLET | Freq: Once | ORAL | Status: AC
  Administered 2017-08-26: 10 mg via ORAL
  Filled 2017-08-26: qty 1
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
## 2017-08-26 NOTE — Discharge Instructions (Signed)
Do not drive while taking the muscle relaxant as it will make you sleepy. Return for worsening symptoms  °

## 2018-09-20 ENCOUNTER — Encounter (HOSPITAL_COMMUNITY): Payer: Self-pay | Admitting: Emergency Medicine

## 2018-09-20 ENCOUNTER — Emergency Department (HOSPITAL_COMMUNITY): Payer: No Typology Code available for payment source

## 2018-09-20 ENCOUNTER — Emergency Department (HOSPITAL_COMMUNITY)
Admission: EM | Admit: 2018-09-20 | Discharge: 2018-09-20 | Disposition: A | Discharge status: Home / Self Care | Payer: No Typology Code available for payment source | Attending: Emergency Medicine | Admitting: Emergency Medicine

## 2018-09-20 ENCOUNTER — Other Ambulatory Visit: Payer: Self-pay

## 2018-09-20 DIAGNOSIS — M7918 Myalgia, other site: Secondary | ICD-10-CM | POA: Diagnosis not present

## 2018-09-20 MED ORDER — NAPROXEN 500 MG PO TABS: 500.0000 mg | ORAL_TABLET | Freq: Two times a day (BID) | ORAL | 0 refills | Status: DC

## 2018-09-20 MED ORDER — IBUPROFEN 400 MG PO TABS
600.0000 mg | ORAL_TABLET | Freq: Once | ORAL | Status: AC
  Administered 2018-09-20: 600 mg via ORAL
  Filled 2018-09-20: qty 1

## 2018-09-20 MED ORDER — METHOCARBAMOL 500 MG PO TABS: 500.0000 mg | ORAL_TABLET | Freq: Two times a day (BID) | ORAL | 0 refills | Status: DC

## 2018-09-20 MED ORDER — CYCLOBENZAPRINE HCL 10 MG PO TABS
10.0000 mg | ORAL_TABLET | Freq: Once | ORAL | Status: AC
  Administered 2018-09-20: 10 mg via ORAL
  Filled 2018-09-20: qty 1
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
## 2018-09-20 NOTE — Discharge Instructions (Addendum)
Do not take the muscle relaxer if driving as it will make you sleepy. Follow up with your doctor. Return here as needed.  °
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
## 2018-09-20 NOTE — ED Notes (Signed)
Patient verbalizes understanding of discharge instructions. Opportunity for questioning and answers were provided. Armband removed by staff, pt discharged from ED ambulatory.   

## 2019-06-12 ENCOUNTER — Emergency Department (HOSPITAL_COMMUNITY)
Admission: EM | Admit: 2019-06-12 | Discharge: 2019-06-12 | Disposition: A | Discharge status: Home / Self Care | Payer: Self-pay | Attending: Emergency Medicine | Admitting: Emergency Medicine

## 2019-06-12 ENCOUNTER — Other Ambulatory Visit: Payer: Self-pay

## 2019-06-12 ENCOUNTER — Emergency Department (HOSPITAL_COMMUNITY): Payer: Self-pay

## 2019-06-12 ENCOUNTER — Encounter (HOSPITAL_COMMUNITY): Payer: Self-pay

## 2019-06-12 DIAGNOSIS — R197 Diarrhea, unspecified: Secondary | ICD-10-CM | POA: Insufficient documentation

## 2019-06-12 DIAGNOSIS — R0781 Pleurodynia: Secondary | ICD-10-CM | POA: Insufficient documentation

## 2019-06-12 NOTE — ED Triage Notes (Signed)
Pt reports pain on his L side around his ribcage. States that he was at a job earlier this week where he had to repetitively lift and push something with his L arm. He states that it has been going on for about 5 days. He thinks he pulled something. No SOB. No urinary symptoms.

## 2019-06-12 NOTE — Discharge Instructions (Addendum)
Please make sure you are staying well hydrated.  As we discussed if you have diarrhea again in an amount that is concerning to you, you develop fevers, abdominal pain, or blood in your stool please consider returning for additional evaluation.

## 2019-06-12 NOTE — ED Provider Notes (Signed)
[facility]-EMERGENCY DEPT Provider Note   CSN: [id number] Arrival date & time: 06/12/19  0236     History   Chief Complaint Chief Complaint  Patient presents with  . Rib Pain    L    HPI Kevin Sullivan is a 28 y.o. male with no significant past medical history who presents today for evaluation of left sided lower rib cage pain.  He reports that he has had symptoms for 5 days.  The day before his symptoms started he started a new job where he had to repetitively push and pull something with his left arm and the pain started after.  He denies any cough or shortness of breath.  He reports that he has had occasional episodes of diarrhea.  Is not had any in the past 24 hours.  He has not had any blood in his diarrhea.  No hematuria.  He denies any nausea.  No fevers or known sick contacts.     HPI  Past Medical History:  Diagnosis Date  . 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 S/P radiation therapy 03-11-14 to 03-16-14                                        Left Ear lobe / 12 Gy in 3 fractions                        Patient Active Problem List   Diagnosis Date Noted  . 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 APPLICATION OF A-CELL OF HEAD/NECK Left 03/10/2014   Procedure: APPLICATION OF A-CELL OF HEAD/NECK;  Surgeon: Wayland Denislaire Sanger, DO;  Location: [facility];  Service: Plastics;  Laterality: Left;  . CLOSED REDUCTION DISTAL RADIUS FRACTURE Right 05/08/2006   with application of cast  . KELOID EXCISION Left    ear lobe  . LESION REMOVAL Left 03/10/2014   Procedure: EXCISION OF LEFT EARLOBE KELOID WITH PLACEMENT OF A- CELL ;  Surgeon: Wayland Denislaire Sanger, DO;  Location: [facility];  Service: Plastics;  Laterality: Left;        Home Medications    Prior to Admission medications   Medication Sig Start Date End Date Taking? Authorizing Provider  ibuprofen (ADVIL) 200 MG tablet Take 400 mg  by mouth every 6 (six) hours as needed.   Yes [provider]  methocarbamol (ROBAXIN) 500 MG tablet Take 1 tablet (500 mg total) by mouth 2 (two) times daily. Patient not taking: Reported on 06/12/2019 09/20/18   Janne NapoleonNeese, Hope M, NP  naproxen (NAPROSYN) 500 MG tablet Take 1 tablet (500 mg total) by mouth 2 (two) times daily. Patient not taking: Reported on 06/12/2019 09/20/18   Janne NapoleonNeese, Hope M, NP    Family History History reviewed. No pertinent family history.  Social History Social History   Tobacco Use  . Smoking status: Never Smoker  . Smokeless tobacco: Never Used  Substance Use Topics  . Alcohol use: No  . Drug use: No     Allergies   Patient has no known allergies.   Review of Systems Review of Systems  Constitutional: Negative for chills and fever.  HENT: Negative for congestion.   Respiratory: Negative for cough, chest tightness and shortness of breath.   Cardiovascular: Positive for chest pain. Negative  for palpitations and leg swelling.  Gastrointestinal: Positive for diarrhea. Negative for abdominal pain, blood in stool, nausea, rectal pain and vomiting.  Musculoskeletal: Negative for back pain and neck pain.  Skin: Negative for color change, rash and wound.  Neurological: Negative for weakness.  All other systems reviewed and are negative.    Physical Exam Updated Vital Signs BP 127/87 (BP Location: Left Arm)   Pulse 62   Temp 98.4 F (36.9 C) (Oral)   Resp 14   Ht 6\' 1"  (1.854 m)   Wt 84.4 kg   SpO2 100%   BMI 24.54 kg/m   Physical Exam Vitals signs and nursing note reviewed.  Constitutional:      General: He is not in acute distress.    Appearance: He is well-developed. He is not diaphoretic.  HENT:     Head: Normocephalic and atraumatic.  Eyes:     General: No scleral icterus.       Right eye: No discharge.        Left eye: No discharge.     Conjunctiva/sclera: Conjunctivae normal.  Neck:     Musculoskeletal: Normal range of motion  and neck supple.  Cardiovascular:     Rate and Rhythm: Normal rate and regular rhythm.     Pulses: Normal pulses.     Heart sounds: Normal heart sounds.  Pulmonary:     Effort: Pulmonary effort is normal. No respiratory distress.     Breath sounds: Normal breath sounds. No stridor. No wheezing.  Abdominal:     General: Abdomen is flat. There is no distension.     Tenderness: There is no abdominal tenderness. There is no guarding.  Musculoskeletal:        General: No deformity.     Comments: There is diffuse tenderness to palpation over the left lower lateral ribs.  Palpation here both re-creates and exacerbates his reported pain.  No crepitus or deformities palpated.  Skin:    General: Skin is warm and dry.  Neurological:     General: No focal deficit present.     Mental Status: He is alert and oriented to person, place, and time.     Motor: No abnormal muscle tone.  Psychiatric:        Behavior: Behavior normal.      ED Treatments / Results  Labs (all labs ordered are listed, but only abnormal results are displayed) Labs Reviewed - No data to display  EKG EKG Interpretation  Date/Time:  Saturday June 12 2019 03:59:48 EDT Ventricular Rate:  71 PR Interval:    QRS Duration: 87 QT Interval:  389 QTC Calculation: 423 R Axis:   68 Text Interpretation:  Sinus rhythm No old tracing to compare Confirmed by Marily MemosMesner, Jason [phone]([id number]) on 06/12/2019 4:58:05 AM   Radiology Dg Chest 2 View  Result Date: 06/12/2019 CLINICAL DATA:  Left-sided chest pain. EXAM: CHEST - 2 VIEW COMPARISON:  None. FINDINGS: The cardiomediastinal contours are normal. The lungs are clear. Pulmonary vasculature is normal. No consolidation, pleural effusion, or pneumothorax. No acute osseous abnormalities are seen. IMPRESSION: Unremarkable radiographs of the chest. Electronically Signed   By: Narda RutherfordMelanie  Sanford M.D.   On: 06/12/2019 03:55    Procedures Procedures (including critical care time)  Medications  Ordered in ED Medications - No data to display   Initial Impression / Assessment and Plan / ED Course  I have reviewed the triage vital signs and the nursing notes.  Pertinent labs & imaging results that were available  during my care of the patient were reviewed by me and considered in my medical decision making (see chart for details).       Presents today for evaluation of left lower rib pain.  His pain started after he started a new job where he had to push and pull a heavy object.  His pain is been present for 5 days.  On exam his pain is recreatable with palpation over the left lateral lower chest.  Chest x-ray and EKG were obtained without evidence of ischemia, pneumothorax, consolidation, or other acute abnormality.  Suspect that his pain is musculoskeletal based.  He does report that he has had diarrhea.  His abdomen is soft, nontender nondistended.  He has not had any diarrhea in the past 24 hours.  He was provided with enough information to make an informed medical decision about his care today.  He was offered labs, imaging, which he declined.  Recommended conservative care including p.o. hydration, brat diet.  Return precautions were discussed with patient who states their understanding.  At the time of discharge patient denied any unaddressed complaints or concerns.  Patient is agreeable for discharge home.  Final Clinical Impressions(s) / ED Diagnoses   Final diagnoses:  Rib pain on left side  Diarrhea, unspecified type    ED Discharge Orders    None       Ollen Gross 06/12/19 0532    Merrily Pew, MD 06/13/19 1112

## 2019-10-14 ENCOUNTER — Encounter (HOSPITAL_COMMUNITY): Payer: Self-pay | Admitting: Emergency Medicine

## 2019-10-14 ENCOUNTER — Emergency Department (HOSPITAL_COMMUNITY)
Admission: EM | Admit: 2019-10-14 | Discharge: 2019-10-14 | Disposition: A | Discharge status: Home / Self Care | Payer: Self-pay | Attending: Emergency Medicine | Admitting: Emergency Medicine

## 2019-10-14 ENCOUNTER — Other Ambulatory Visit: Payer: Self-pay

## 2019-10-14 DIAGNOSIS — Z79899 Other long term (current) drug therapy: Secondary | ICD-10-CM | POA: Insufficient documentation

## 2019-10-14 DIAGNOSIS — N342 Other urethritis: Secondary | ICD-10-CM | POA: Insufficient documentation

## 2019-10-14 LAB — URINALYSIS, ROUTINE W REFLEX MICROSCOPIC
Bacteria, UA: NONE SEEN
Bilirubin Urine: NEGATIVE
Glucose, UA: NEGATIVE mg/dL
Hgb urine dipstick: NEGATIVE
Ketones, ur: NEGATIVE mg/dL
Nitrite: NEGATIVE
Protein, ur: NEGATIVE mg/dL
Specific Gravity, Urine: 1.029 (ref 1.005–1.030)
pH: 5 (ref 5.0–8.0)

## 2019-10-14 MED ORDER — AZITHROMYCIN 1 G PO PACK
1.0000 g | PACK | Freq: Once | ORAL | Status: AC
  Administered 2019-10-14: 02:00:00 1 g via ORAL
  Filled 2019-10-14: qty 1

## 2019-10-14 MED ORDER — CEFTRIAXONE SODIUM 250 MG IJ SOLR
250.0000 mg | Freq: Once | INTRAMUSCULAR | Status: AC
  Administered 2019-10-14: 02:00:00 250 mg via INTRAMUSCULAR
  Filled 2019-10-14: qty 250

## 2019-10-14 MED ORDER — STERILE WATER FOR INJECTION IJ SOLN
INTRAMUSCULAR | Status: AC
  Administered 2019-10-14: 02:00:00 10 mL
  Filled 2019-10-14: qty 10

## 2019-10-14 NOTE — ED Provider Notes (Signed)
[facility]-EMERGENCY DEPT Provider Note   CSN: [id number] Arrival date & time: 10/14/19  0018     History   Chief Complaint No chief complaint on file.   HPI Kevin Sullivan is a 28 y.o. male.     The history is provided by the patient.  Illness Location:  Penis Quality:  Internal itching Severity:  Moderate Onset quality:  Gradual Timing:  Constant Progression:  Unchanged Chronicity:  Recurrent Context:  Unprotected sexual encounters Relieved by:  Nothing Worsened by:  Nothing Ineffective treatments:  None tried Associated symptoms: no abdominal pain, no chest pain, no congestion, no cough, no diarrhea, no fever, no rash and no vomiting   Risk factors:  Previous ST Patient presents with internal penile itching and states he "knows I have chlamydia because I had it before."  Denies partner with symptoms but has had unprotected encounters.  Patient claims to have been tested in our system but there is no results.    Past Medical History:  Diagnosis Date  . 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 S/P radiation therapy 03-11-14 to 03-16-14                                        Left Ear lobe / 12 Gy in 3 fractions                        Patient Active Problem List   Diagnosis Date Noted  . 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 APPLICATION OF A-CELL OF HEAD/NECK Left 03/10/2014   Procedure: APPLICATION OF A-CELL OF HEAD/NECK;  Surgeon: Wayland Denislaire Sanger, DO;  Location: [facility];  Service: Plastics;  Laterality: Left;  . CLOSED REDUCTION DISTAL RADIUS FRACTURE Right 05/08/2006   with application of cast  . KELOID EXCISION Left    ear lobe  . LESION REMOVAL Left 03/10/2014   Procedure: EXCISION OF LEFT EARLOBE KELOID WITH PLACEMENT OF A- CELL ;  Surgeon: Wayland Denislaire Sanger, DO;  Location: [facility];  Service: Plastics;  Laterality: Left;        Home Medications    Prior to  Admission medications   Medication Sig Start Date End Date Taking? Authorizing Provider  ibuprofen (ADVIL) 200 MG tablet Take 400 mg by mouth every 6 (six) hours as needed.    [provider]  methocarbamol (ROBAXIN) 500 MG tablet Take 1 tablet (500 mg total) by mouth 2 (two) times daily. Patient not taking: Reported on 06/12/2019 09/20/18   Janne NapoleonNeese, Hope M, NP  naproxen (NAPROSYN) 500 MG tablet Take 1 tablet (500 mg total) by mouth 2 (two) times daily. Patient not taking: Reported on 06/12/2019 09/20/18   Janne NapoleonNeese, Hope M, NP    Family History No family history on file.  Social History Social History   Tobacco Use  . Smoking status: Never Smoker  . Smokeless tobacco: Never Used  Substance Use Topics  . Alcohol use: No  . Drug use: No     Allergies   Patient has no known allergies.   Review of Systems Review of Systems  Constitutional: Negative for fever.  HENT: Negative for congestion.   Eyes: Negative for visual disturbance.  Respiratory: Negative for cough.   Cardiovascular: Negative for chest pain.  Gastrointestinal: Negative for abdominal pain,  diarrhea and vomiting.  Genitourinary: Positive for penile pain. Negative for difficulty urinating, discharge and dysuria.  Musculoskeletal: Negative for arthralgias.  Skin: Negative for rash.  Neurological: Negative for weakness.  Psychiatric/Behavioral: Negative for agitation.  All other systems reviewed and are negative.    Physical Exam Updated Vital Signs BP 128/72 (BP Location: Right Arm)   Pulse 67   Temp 98.7 F (37.1 C) (Oral)   Resp 17   Ht 6\' 1"  (1.854 m)   Wt 88 kg   SpO2 95%   BMI 25.60 kg/m   Physical Exam Vitals signs and nursing note reviewed.  Constitutional:      General: He is not in acute distress. HENT:     Head: Normocephalic and atraumatic.     Nose: Nose normal.  Eyes:     Conjunctiva/sclera: Conjunctivae normal.     Pupils: Pupils are equal, round, and reactive to light.  Neck:      Musculoskeletal: Normal range of motion and neck supple.  Cardiovascular:     Rate and Rhythm: Normal rate and regular rhythm.     Pulses: Normal pulses.     Heart sounds: Normal heart sounds.  Pulmonary:     Effort: Pulmonary effort is normal.     Breath sounds: Normal breath sounds.  Abdominal:     General: Abdomen is flat. Bowel sounds are normal.     Tenderness: There is no abdominal tenderness. There is no guarding.  Genitourinary:    Penis: Normal.      Comments: Chaperone present for swab Musculoskeletal: Normal range of motion.  Skin:    General: Skin is warm and dry.     Capillary Refill: Capillary refill takes less than 2 seconds.  Neurological:     General: No focal deficit present.     Mental Status: He is alert.  Psychiatric:        Mood and Affect: Mood normal.        Behavior: Behavior normal.      ED Treatments / Results  Labs (all labs ordered are listed, but only abnormal results are displayed) Labs Reviewed  URINALYSIS, ROUTINE W REFLEX MICROSCOPIC  GC/CHLAMYDIA PROBE AMP ([place]) NOT AT [facility]    EKG None  Radiology No results found.  Procedures Procedures (including critical care time)  Medications Ordered in ED Medications  cefTRIAXone (ROCEPHIN) injection 250 mg (has no administration in time range)  azithromycin (ZITHROMAX) powder 1 g (has no administration in time range)     Patient is annoyed that we cannot find his previous test in Epic and care everywhere.  I have apologized for his inconvenience but stated we must reswab the patient.  I states that the patient has the option of following up with the county health department instead but he elects to be swabbed here. Urine shows sterile pyuria.   No sexual activity of any kind until 7 days after all partners treated.  Follow up with the county health department.    Kevin Sullivan was evaluated in Emergency Department on 10/14/2019 for the symptoms described in the  history of present illness. He was evaluated in the context of the global COVID-19 pandemic, which necessitated consideration that the patient might be at risk for infection with the SARS-CoV-2 virus that causes COVID-19. Institutional protocols and algorithms that pertain to the evaluation of patients at risk for COVID-19 are in a state of rapid change based on information released by regulatory bodies including the CDC and federal  and state organizations. These policies and algorithms were followed during the patient's care in the ED.  Final Clinical Impressions(s) / ED Diagnoses   Return for weakness, numbness, changes in vision or speech, fevers >100.4 unrelieved by medication, shortness of breath, intractable vomiting, or diarrhea, abdominal pain, Inability to tolerate liquids or food, cough, altered mental status or any concerns. No signs of systemic illness or infection. The patient is nontoxic-appearing on exam and vital signs are within normal limits.   I have reviewed the triage vital signs and the nursing notes. Pertinent labs &imaging results that were available during my care of the patient were reviewed by me and considered in my medical decision making (see chart for details).  After history, exam, and medical workup I feel the patient has been appropriately medically screened and is safe for discharge home. Pertinent diagnoses were discussed with the patient. Patient was given return precautions   Felice Hope, MD 10/14/19 [id number]

## 2019-10-14 NOTE — Discharge Instructions (Signed)
You must inform all sexual partners.  No sexual activity of any kind, even with a condom until 7 days after all partners treated.

## 2019-10-14 NOTE — ED Triage Notes (Signed)
Pt states that he was exposed to STD about three days ago

## 2019-10-15 LAB — GC/CHLAMYDIA PROBE AMP (~~LOC~~) NOT AT ARMC
Chlamydia: POSITIVE — AB
Neisseria Gonorrhea: NEGATIVE

## 2020-06-05 ENCOUNTER — Emergency Department (HOSPITAL_COMMUNITY)
Admission: EM | Admit: 2020-06-05 | Discharge: 2020-06-05 | Disposition: A | Discharge status: Home / Self Care | Payer: Self-pay | Attending: Emergency Medicine | Admitting: Emergency Medicine

## 2020-06-05 ENCOUNTER — Encounter (HOSPITAL_COMMUNITY): Payer: Self-pay

## 2020-06-05 ENCOUNTER — Encounter (HOSPITAL_COMMUNITY): Payer: Self-pay | Admitting: Emergency Medicine

## 2020-06-05 ENCOUNTER — Other Ambulatory Visit: Payer: Self-pay

## 2020-06-05 DIAGNOSIS — Z5321 Procedure and treatment not carried out due to patient leaving prior to being seen by health care provider: Secondary | ICD-10-CM | POA: Insufficient documentation

## 2020-06-05 DIAGNOSIS — L299 Pruritus, unspecified: Secondary | ICD-10-CM | POA: Insufficient documentation

## 2020-06-05 DIAGNOSIS — R21 Rash and other nonspecific skin eruption: Secondary | ICD-10-CM | POA: Insufficient documentation

## 2020-06-05 MED ORDER — DIPHENHYDRAMINE HCL 50 MG/ML IJ SOLN
25.0000 mg | Freq: Once | INTRAMUSCULAR | Status: AC
  Administered 2020-06-05: 25 mg via INTRAMUSCULAR
  Filled 2020-06-05: qty 1

## 2020-06-05 MED ORDER — DEXAMETHASONE SODIUM PHOSPHATE 10 MG/ML IJ SOLN
INTRAMUSCULAR | Status: AC
  Administered 2020-06-05: 10 mg via INTRAMUSCULAR
  Filled 2020-06-05: qty 1

## 2020-06-05 MED ORDER — HYDROXYZINE HCL 25 MG PO TABS: 25.0000 mg | ORAL_TABLET | Freq: Four times a day (QID) | ORAL | 0 refills | Status: AC

## 2020-06-05 MED ORDER — DEXAMETHASONE SODIUM PHOSPHATE 10 MG/ML IJ SOLN
10.0000 mg | Freq: Once | INTRAMUSCULAR | Status: AC
  Administered 2020-06-05: 10 mg via INTRAMUSCULAR
  Filled 2020-06-05: qty 1

## 2020-06-05 NOTE — ED Triage Notes (Signed)
Patient arrived with complaints of a unknown bug bite on left buttocks from Thursday. States the itching has now moved down his thigh.

## 2020-06-05 NOTE — ED Triage Notes (Signed)
Pt. Stated, I have a bump or allergic reaction . It started as a bump to a rash and swelling. My left leg itches also.

## 2020-06-05 NOTE — Discharge Instructions (Addendum)
You have received a steroid shot today which lasts for 2-3 days. This should help clear up the rash Take Hydroxyzine as needed for itching every 6 hours - this medicine can make you sleepy Please return if you are worsening

## 2020-06-05 NOTE — ED Provider Notes (Signed)
[name] Provider Note   CSN: [id number] Arrival date & time: 06/05/20  1601     History Chief Complaint  Patient presents with  . Abscess    Kevin Sullivan is a 29 y.o. male who presents with a rash. He states he noticed a bump on his left buttocks on Thursday while he was at work. He thinks something bit him but he's not sure what it was.  He works in a [employer]. He did pick at it and it's been irritated. Over the weeked the area has spread and become more red and irritated. He also notes that he's had itching of the front of his left thigh which he thinks is connected to the rash on his buttocks but states that there is no rash on his leg. He denies hx of herpes. He denies hx of chickenpox or getting the chickenpox vaccine. No fever or chills. He has tried Benadryl without relief.   HPI     Past Medical History:  Diagnosis Date  . 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 S/P radiation therapy 03-11-14 to 03-16-14                                        Left Ear lobe / 12 Gy in 3 fractions                        Patient Active Problem List   Diagnosis Date Noted  . 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 APPLICATION OF A-CELL OF HEAD/NECK Left 03/10/2014   Procedure: APPLICATION OF A-CELL OF HEAD/NECK;  Surgeon: Theodoro Kos, DO;  Location: [facility];  Service: Plastics;  Laterality: Left;  . CLOSED REDUCTION DISTAL RADIUS FRACTURE Right 0/93/2355   with application of cast  . KELOID EXCISION Left    ear lobe  . LESION REMOVAL Left 03/10/2014   Procedure: EXCISION OF LEFT EARLOBE KELOID WITH PLACEMENT OF A- CELL ;  Surgeon: Theodoro Kos, DO;  Location: [facility];  Service: Plastics;  Laterality: Left;       No family history on file.  Social History   Tobacco Use  . Smoking status: Never Smoker  . Smokeless tobacco: Never Used  Vaping Use  .  Vaping Use: Never used  Substance Use Topics  . Alcohol use: No  . Drug use: No    Home Medications Prior to Admission medications   Medication Sig Start Date End Date Taking? Authorizing Provider  ibuprofen (ADVIL) 200 MG tablet Take 400 mg by mouth every 6 (six) hours as needed.    [provider]  methocarbamol (ROBAXIN) 500 MG tablet Take 1 tablet (500 mg total) by mouth 2 (two) times daily. Patient not taking: Reported on 06/12/2019 09/20/18   Ashley Murrain, NP  naproxen (NAPROSYN) 500 MG tablet Take 1 tablet (500 mg total) by mouth 2 (two) times daily. Patient not taking: Reported on 06/12/2019 09/20/18   Ashley Murrain, NP    Allergies    Patient has no known allergies.  Review of Systems   Review of Systems  Constitutional: Negative for fever.  Skin: Positive for rash.       +itching    Physical Exam Updated Vital Signs BP 128/73 (BP Location: Left Arm)  Pulse 70   Temp 98.2 F (36.8 C) (Oral)   Resp 16   SpO2 100%   Physical Exam Vitals and nursing note reviewed.  Constitutional:      General: He is not in acute distress.    Appearance: Normal appearance. He is well-developed. He is not ill-appearing.  HENT:     Head: Normocephalic and atraumatic.  Eyes:     General: No scleral icterus.       Right eye: No discharge.        Left eye: No discharge.     Conjunctiva/sclera: Conjunctivae normal.     Pupils: Pupils are equal, round, and reactive to light.  Cardiovascular:     Rate and Rhythm: Normal rate.  Pulmonary:     Effort: Pulmonary effort is normal. No respiratory distress.  Abdominal:     General: There is no distension.  Genitourinary:    Comments: Maculopapular rash in a circular distribution on the left buttocks with several small excoriated areas  Musculoskeletal:     Cervical back: Normal range of motion.  Skin:    General: Skin is warm and dry.  Neurological:     Mental Status: He is alert and oriented to person, place, and time.    Psychiatric:        Behavior: Behavior normal.       ED Results / Procedures / Treatments   Labs (all labs ordered are listed, but only abnormal results are displayed) Labs Reviewed - No data to display  EKG None  Radiology No results found.  Procedures Procedures (including critical care time)  Medications Ordered in ED Medications  dexamethasone (DECADRON) injection 10 mg (has no administration in time range)  diphenhydrAMINE (BENADRYL) injection 25 mg (has no administration in time range)    ED Course  I have reviewed the triage vital signs and the nursing notes.  Pertinent labs & imaging results that were available during my care of the patient were reviewed by me and considered in my medical decision making (see chart for details).  29 year old male presents with nonspecific rash on the left buttocks for the past several days and left leg itching.  His vital signs are normal.  He is well-appearing.  On examination of the rash is maculopapular in nature.  There are no vesicular lesions.  Patient thinks it is an allergic reaction from an insect bite.  Other possibility is could be herpes or shingles although I do not see any vesicular lesions at this time.  We will treat for allergic reaction with IM Decadron, and Benadryl and he was given a prescription for hydroxyzine.  MDM Rules/Calculators/A&P                           Final Clinical Impression(s) / ED Diagnoses Final diagnoses:  Rash and nonspecific skin eruption    Rx / DC Orders ED Discharge Orders    None       Bethel Born, PA-C 06/05/20 1248    Cathren Laine, MD 06/06/20 [phone]

## 2020-12-28 IMAGING — CR CHEST - 2 VIEW
2 series · 2 of 2 positions shown · non-contrast
Comparison: None.

CLINICAL DATA: Left-sided chest pain.

EXAM:
CHEST - 2 VIEW

[w chest pa]
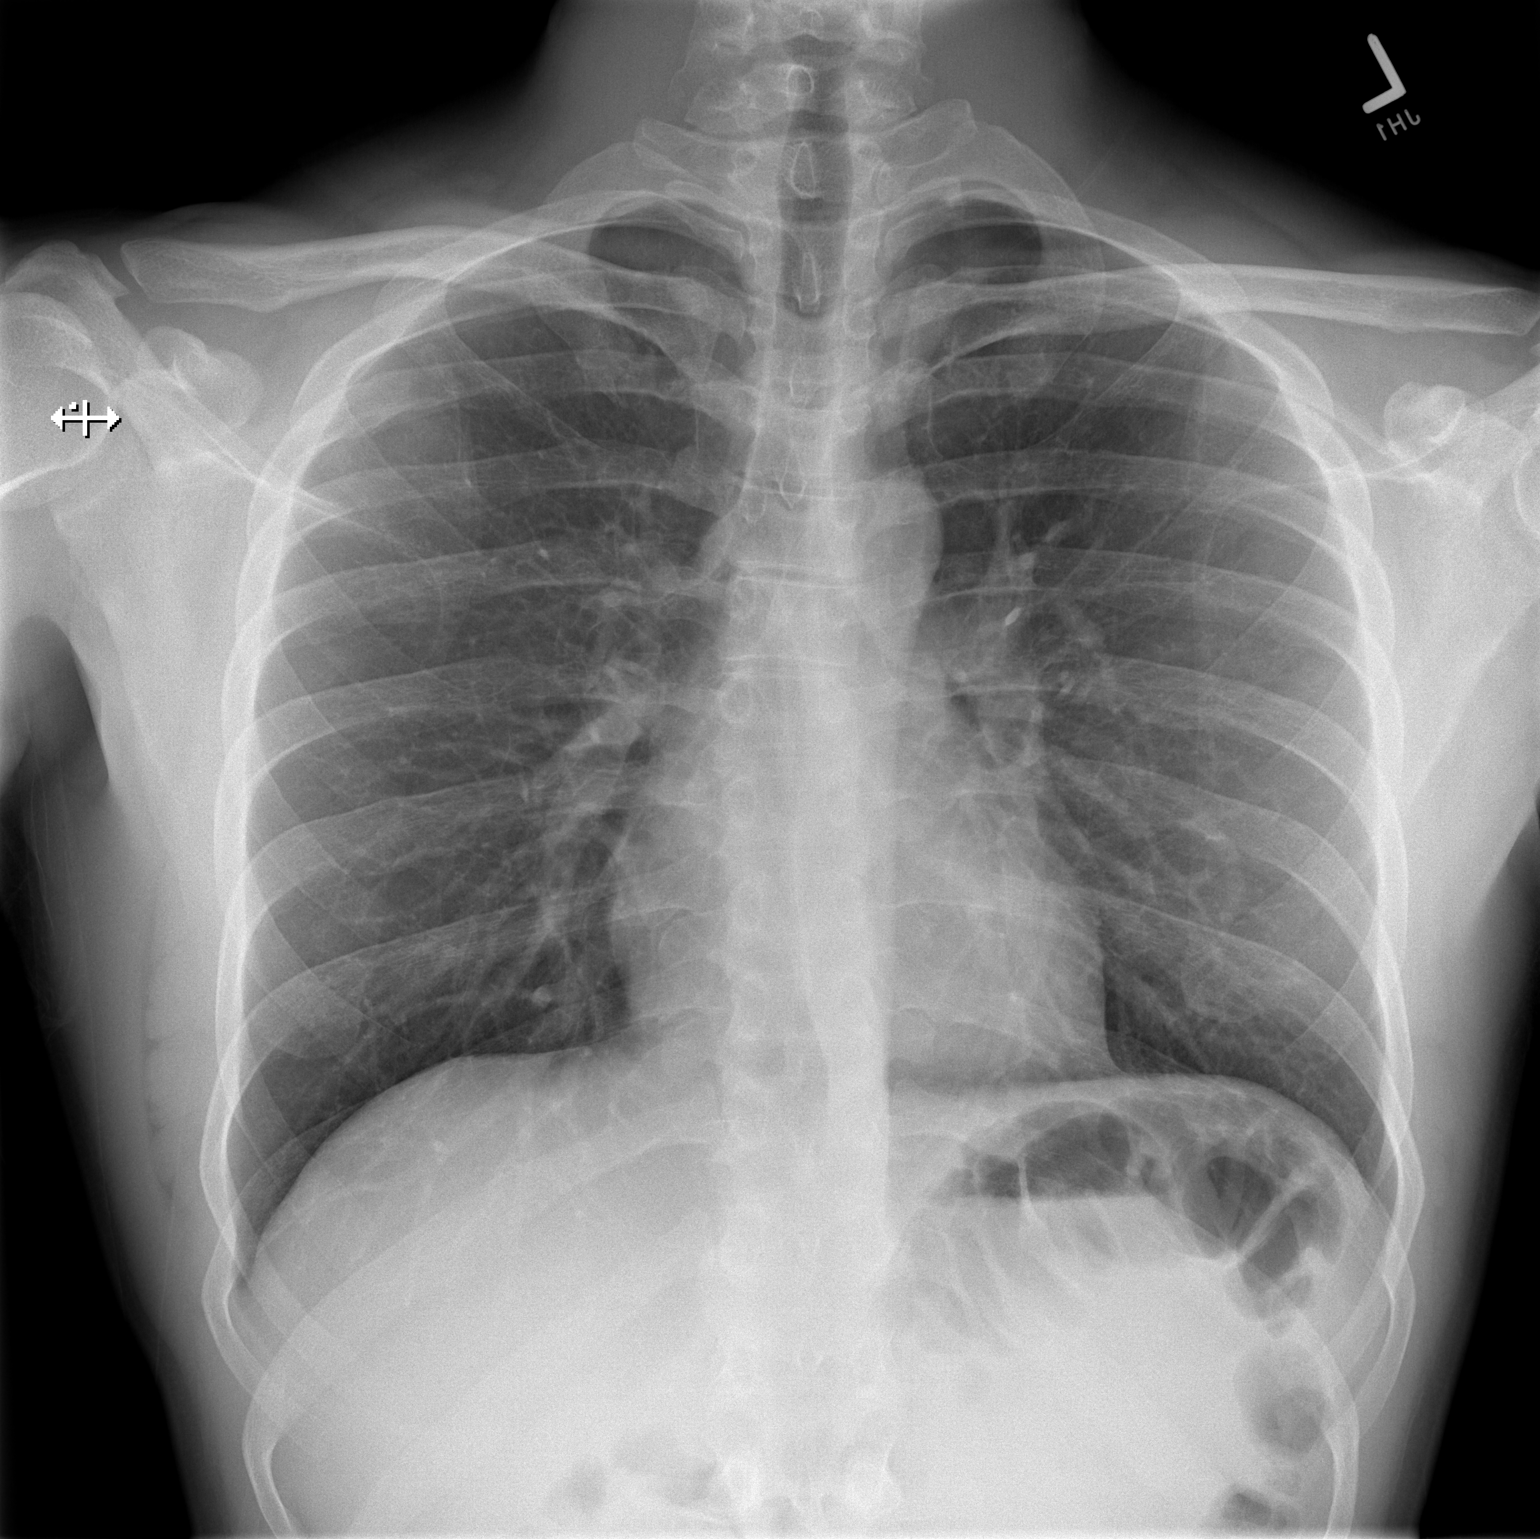

[w chest lat]
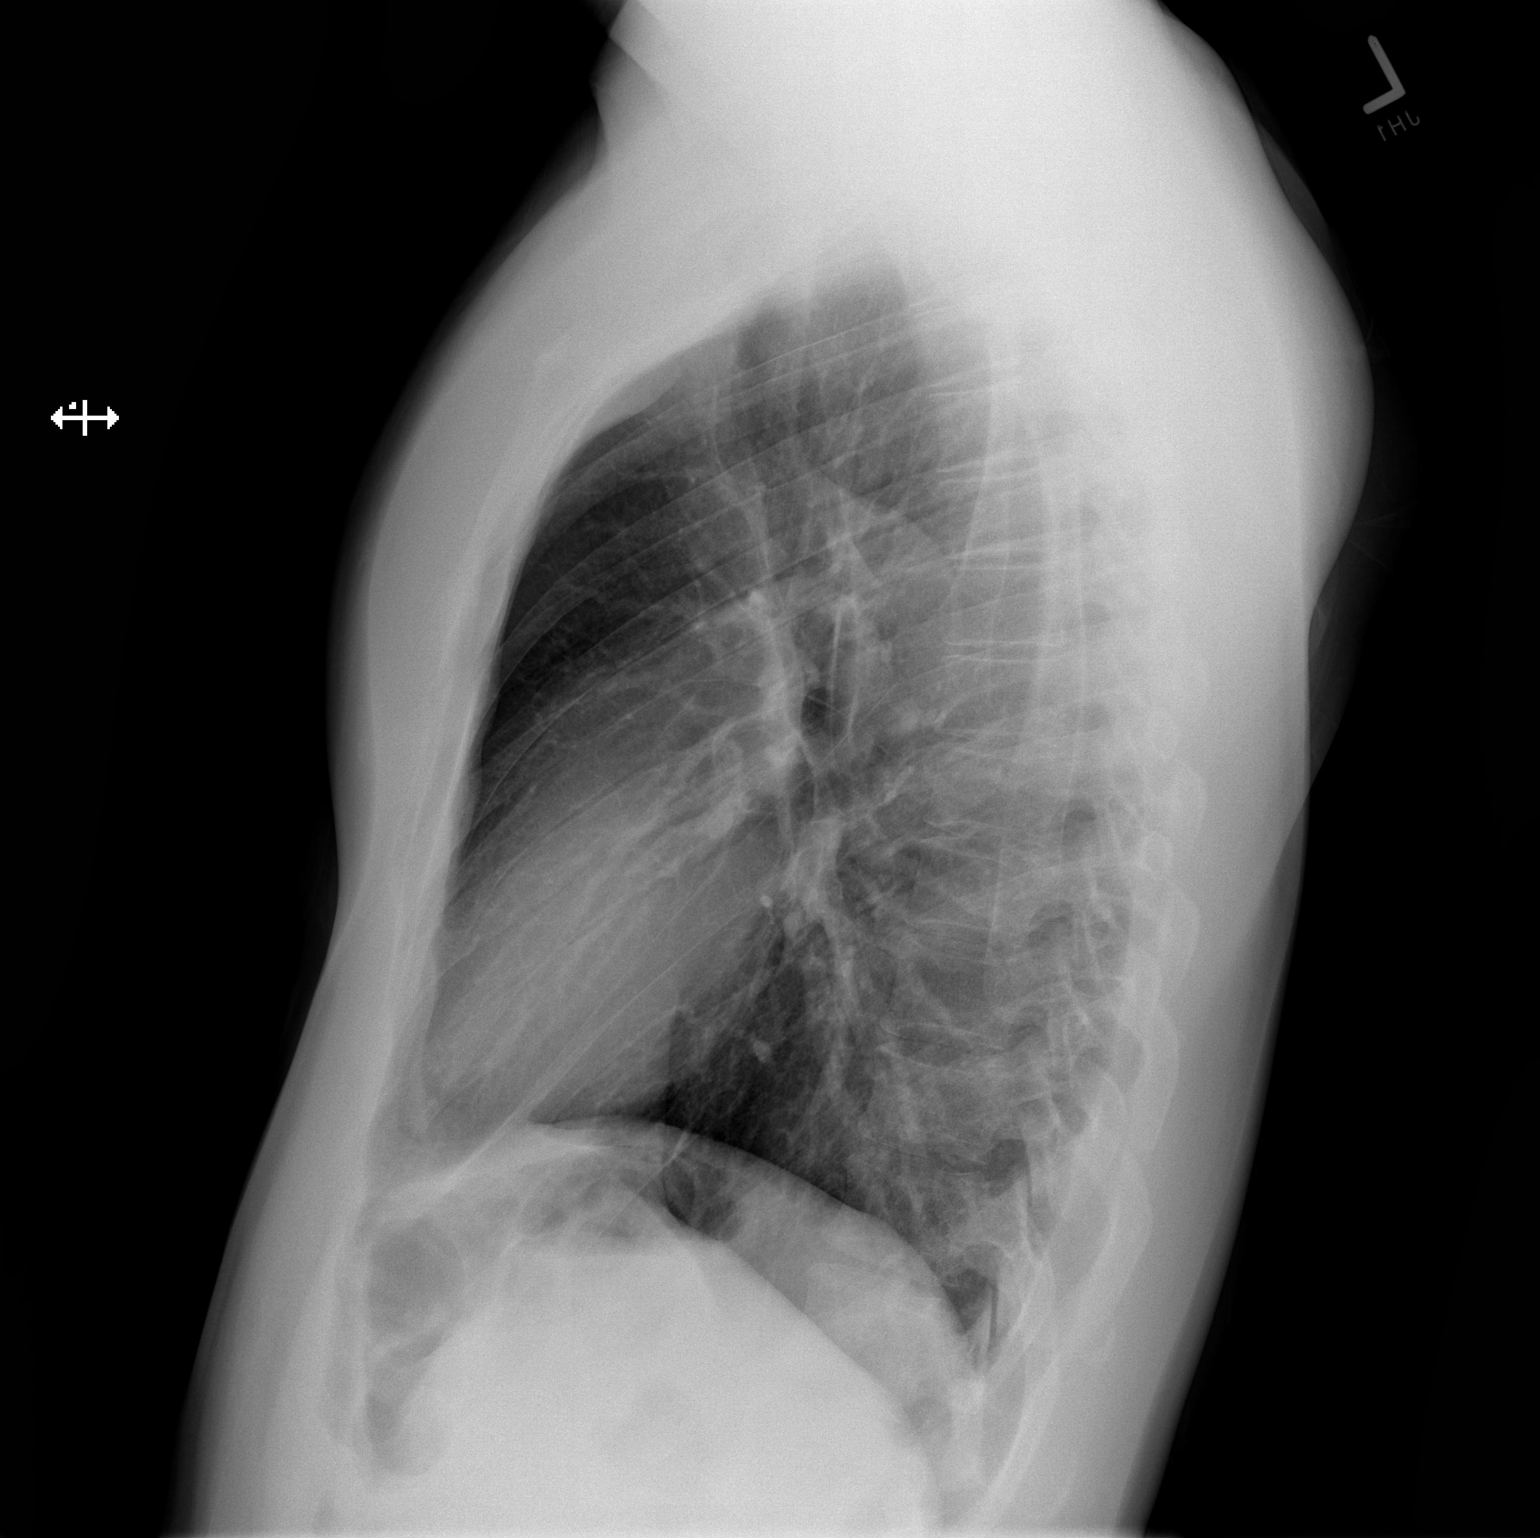

[2 of 2 positions shown; findings below may reference images not displayed]

FINDINGS: The cardiomediastinal contours are normal. The lungs are clear.
Pulmonary vasculature is normal. No consolidation, pleural effusion,
or pneumothorax. No acute osseous abnormalities are seen.
IMPRESSION: Unremarkable radiographs of the chest.
